# Patient Record
Sex: Male | Born: 2000 | Race: Black or African American | Hispanic: No | Marital: Single | Smoking: Never smoker
Health system: Southern US, Community
[De-identification: ages and names within clinical notes are randomized; demographics above are authoritative.]

---
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
## 2020-12-21 ENCOUNTER — Encounter (HOSPITAL_COMMUNITY): Payer: Self-pay | Admitting: Emergency Medicine

## 2020-12-21 ENCOUNTER — Emergency Department (HOSPITAL_COMMUNITY): Payer: Managed Care, Other (non HMO)

## 2020-12-21 ENCOUNTER — Other Ambulatory Visit: Payer: Self-pay

## 2020-12-21 ENCOUNTER — Emergency Department (HOSPITAL_COMMUNITY)
Admission: EM | Admit: 2020-12-21 | Discharge: 2020-12-22 | Disposition: A | Discharge status: Home / Self Care | Payer: Managed Care, Other (non HMO) | Attending: Emergency Medicine | Admitting: Emergency Medicine

## 2020-12-21 DIAGNOSIS — J029 Acute pharyngitis, unspecified: Secondary | ICD-10-CM | POA: Insufficient documentation

## 2020-12-21 DIAGNOSIS — Z5321 Procedure and treatment not carried out due to patient leaving prior to being seen by health care provider: Secondary | ICD-10-CM | POA: Insufficient documentation

## 2020-12-21 DIAGNOSIS — Z20822 Contact with and (suspected) exposure to covid-19: Secondary | ICD-10-CM | POA: Insufficient documentation

## 2020-12-21 LAB — CBC WITH DIFFERENTIAL/PLATELET
Abs Immature Granulocytes: 0.11 10*3/uL — ABNORMAL HIGH (ref 0.00–0.07)
Basophils Absolute: 0.1 10*3/uL (ref 0.0–0.1)
Basophils Relative: 0 %
Eosinophils Absolute: 0.1 10*3/uL (ref 0.0–0.5)
Eosinophils Relative: 0 %
HCT: 42.6 % (ref 39.0–52.0)
Hemoglobin: 12.7 g/dL — ABNORMAL LOW (ref 13.0–17.0)
Immature Granulocytes: 1 %
Lymphocytes Relative: 26 %
Lymphs Abs: 4.4 10*3/uL — ABNORMAL HIGH (ref 0.7–4.0)
MCH: 22.7 pg — ABNORMAL LOW (ref 26.0–34.0)
MCHC: 29.8 g/dL — ABNORMAL LOW (ref 30.0–36.0)
MCV: 76.2 fL — ABNORMAL LOW (ref 80.0–100.0)
Monocytes Absolute: 1.6 10*3/uL — ABNORMAL HIGH (ref 0.1–1.0)
Monocytes Relative: 9 %
Neutro Abs: 11 10*3/uL — ABNORMAL HIGH (ref 1.7–7.7)
Neutrophils Relative %: 64 %
Platelets: 337 10*3/uL (ref 150–400)
RBC: 5.59 MIL/uL (ref 4.22–5.81)
RDW: 14.6 % (ref 11.5–15.5)
WBC: 17.3 10*3/uL — ABNORMAL HIGH (ref 4.0–10.5)
nRBC: 0 % (ref 0.0–0.2)

## 2020-12-21 NOTE — ED Provider Notes (Signed)
Emergency Medicine Provider Triage Evaluation Note  Craig Carroll , a 20 y.o. male  was evaluated in triage.  Pt complains of sore throat.  The patient reports that he developed a sore throat approximately 2 weeks ago.  About a week ago, the patient started noticing swelling to his bilateral neck, right greater than left.  He reports that the onset of swelling has been gradual.  He does feel as if the swelling is slightly improved today more so than the last few days, but is concerned due to how long his symptoms have been ongoing without resolving.  No fever, chills, trismus, drooling, throat closing, tongue or lip swelling, shortness of breath, wheezing, nasal congestion, vomiting.  No chronic medical conditions.   Review of Systems  Positive: Sore throat, neck swelling, lymphadenopathy Negative: Fever, chills, headache, shortness of breath, throat closing, rash, vomiting, chest pain, neck stiffness, voice change, difficulty swallowing  Physical Exam  BP (!) 160/96 (BP Location: Left Arm)   Pulse 72   Temp 98.4 F (36.9 C) (Oral)   Resp 20   Ht 5\' 10"  (1.778 m)   Wt 93 kg   SpO2 100%   BMI 29.42 kg/m  Gen:   Awake, no distress    Resp:  Normal effort  MSK:   Moves extremities without difficulty  Other:  No trismus.  Tonsils 3+ on the right and 2+ on the left.  Uvula is midline.  Tolerating secretions without difficulty.  There is bilateral anterior cervical lymphadenopathy.  There is also a soft, mass noted to the right anterolateral neck.  No posterior cervical lymphadenopathy.  No meningismus.  Phonation is normal.  Able to speak in complete, fluent sentences without increased work of breathing.  Medical Decision Making  Medically screening exam initiated at 11:12 PM.  Appropriate orders placed.  Craig Carroll was informed that the remainder of the evaluation will be completed by another provider, this initial triage assessment does not replace that evaluation, and the  importance of remaining in the ED until their evaluation is complete.  20 year old male with no chronic medical conditions presenting with a 2-week history of sore throat and gradually worsening bilateral neck swelling over the last week.  Airway is widely patent at this time.  No constitutional symptoms.  On exam, he does have bilateral anterior cervical lymphadenopathy.  There is also a soft, mobile mass noted to the right anterior neck.  I suspect this is a confluence of lymph nodes, but swelling is bilateral on the neck and tonsillar edema is asymmetric.  It could be peritonsillar abscess, but given bilateral tonsillar edema and neck swelling, will order basic labs and a CT scan for further imaging.  Patient will require further work-up and evaluation in the emergency department.   12 A, PA-C 12/21/20 2316    Little, 12/23/20, MD 12/21/20 [phone]

## 2020-12-21 NOTE — ED Triage Notes (Signed)
Patient reports worsening sore throat with swollen tonsils onset last week , no SOB , denies fever or chills .

## 2020-12-22 LAB — BASIC METABOLIC PANEL
Anion gap: 6 (ref 5–15)
BUN: 7 mg/dL (ref 6–20)
CO2: 30 mmol/L (ref 22–32)
Calcium: 9.6 mg/dL (ref 8.9–10.3)
Chloride: 102 mmol/L (ref 98–111)
Creatinine, Ser: 0.78 mg/dL (ref 0.61–1.24)
GFR, Estimated: >60 mL/min (ref >60)
Glucose, Bld: 108 mg/dL — ABNORMAL HIGH (ref 70–99)
Potassium: 4.6 mmol/L (ref 3.5–5.1)
Sodium: 138 mmol/L (ref 135–145)

## 2020-12-22 LAB — GROUP A STREP BY PCR: Group A Strep by PCR: DETECTED — AB

## 2020-12-22 LAB — MONONUCLEOSIS SCREEN: Mono Screen: NEGATIVE

## 2020-12-22 LAB — RESP PANEL BY RT-PCR (FLU A&B, COVID) ARPGX2
Influenza A by PCR: NEGATIVE
Influenza B by PCR: NEGATIVE
SARS Coronavirus 2 by RT PCR: NEGATIVE

## 2020-12-22 NOTE — ED Notes (Signed)
Patient called for vitals recheck with no response 

## 2023-01-01 IMAGING — CT CT NECK W/O CM
3 of 6 series · 11 of 33 positions shown, 13 images · non-contrast
Comparison: None available.

CLINICAL DATA: Initial evaluation for acute sore throat.

EXAM:
CT NECK WITHOUT CONTRAST
TECHNIQUE: Multidetector CT imaging of the neck was performed following the
standard protocol without intravenous contrast.

[Series 3: neck 2.0 st · axial · 0.45mm/px · z∈[+1378,+1526]mm · 3 of 149 slices shown, 4 images]
[im 38/149  soft-tissue]
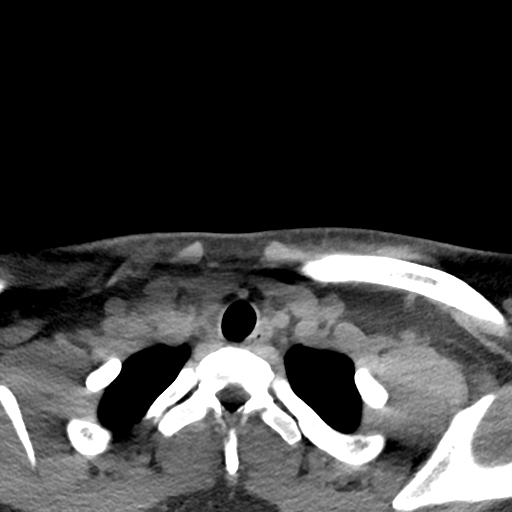
[im 38/149  bone]
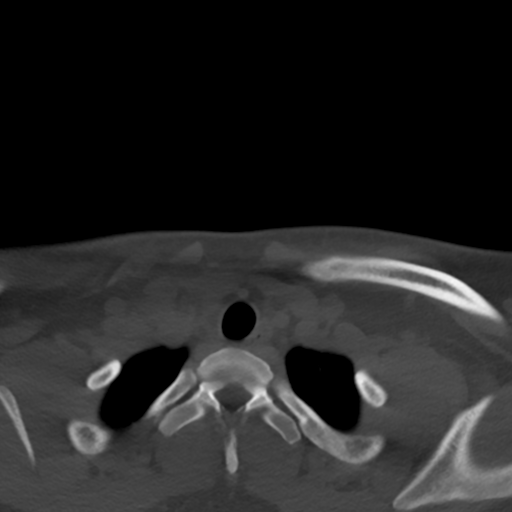
[im 75/149  bone]
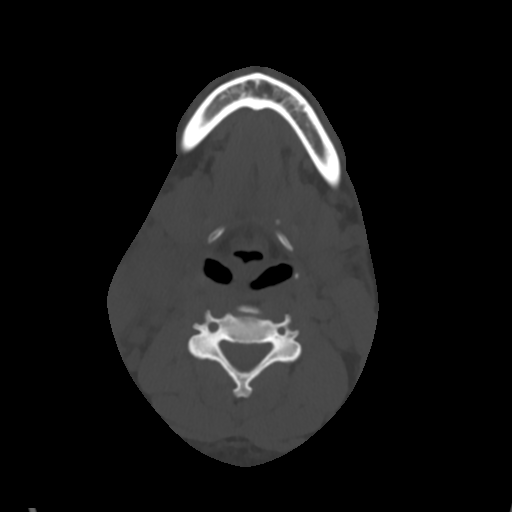
[im 112/149  bone]
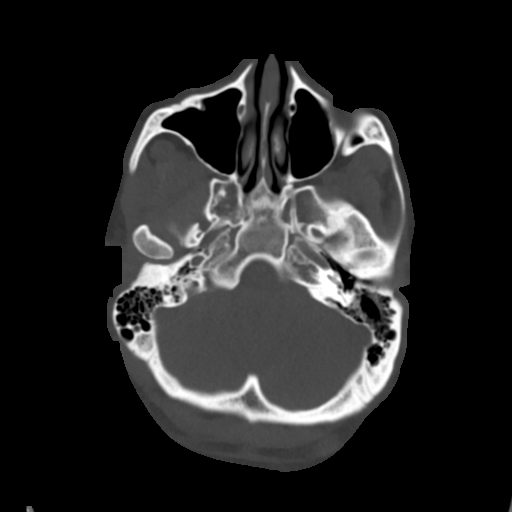

[Series 5: sagittal · sagittal · 0.43mm/px · 5 of 101 slices shown, 6 images]
[im 34/101  bone]
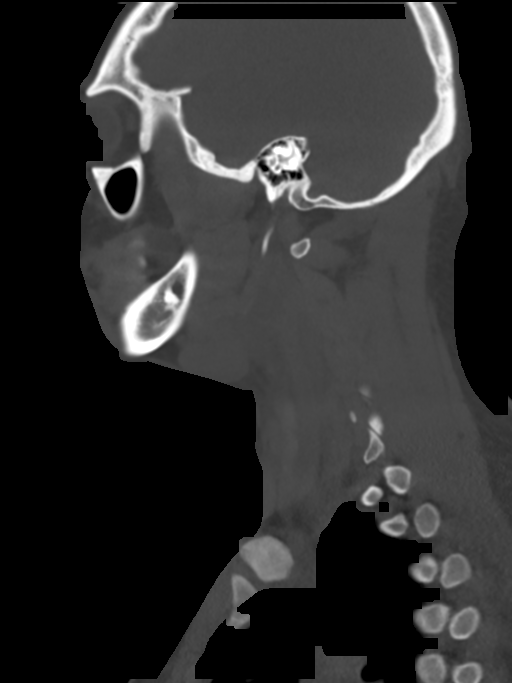
[im 42/101  bone]
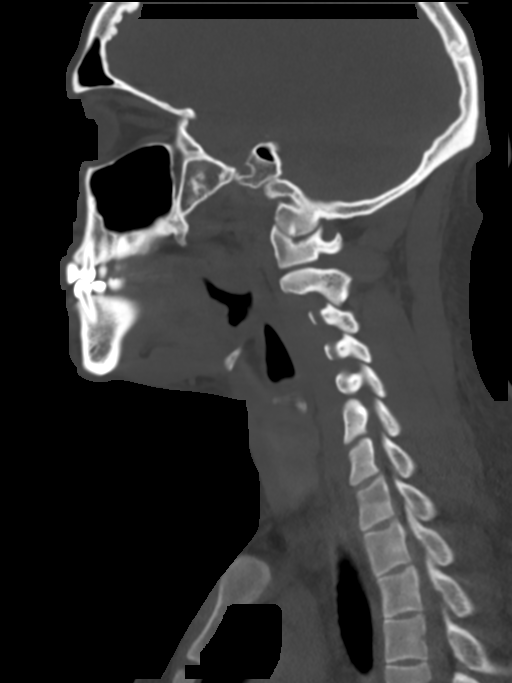
[im 51/101  soft-tissue]
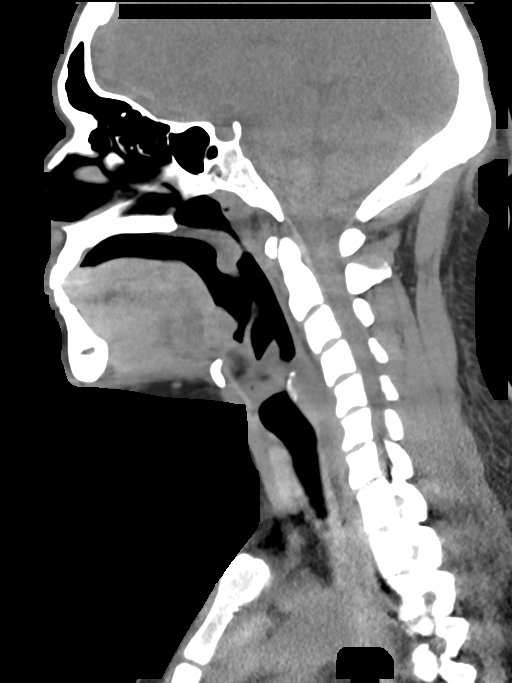
[im 51/101  bone]
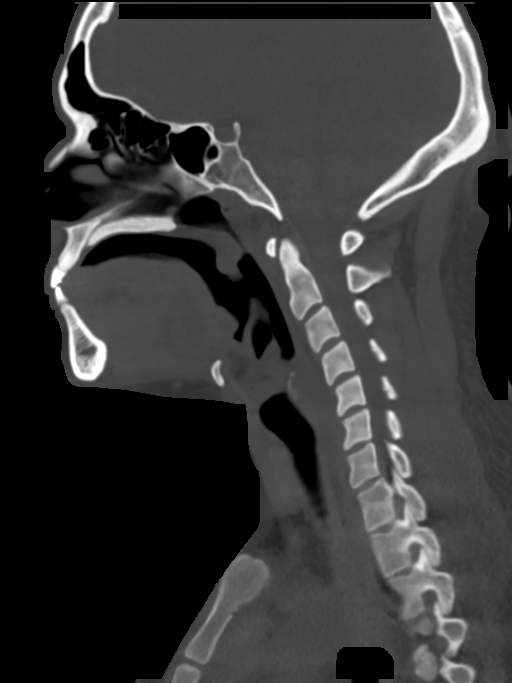
[im 59/101  bone]
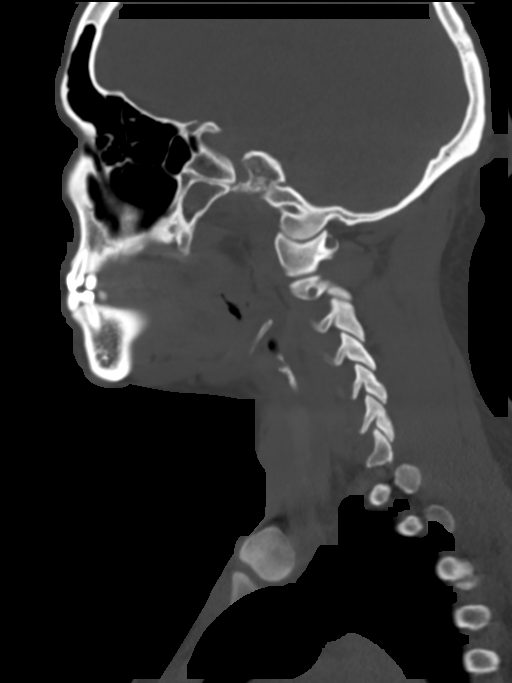
[im 67/101  bone]
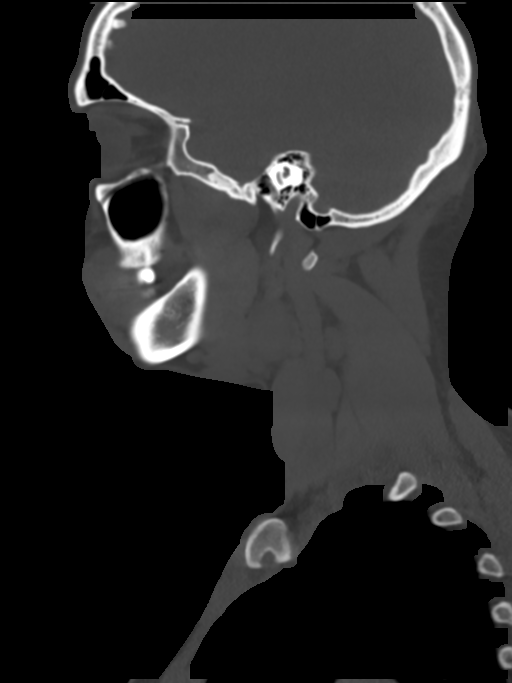

[Series 6: coronal · coronal · 0.35mm/px · 3 of 110 slices shown]
[im 22/110  bone]
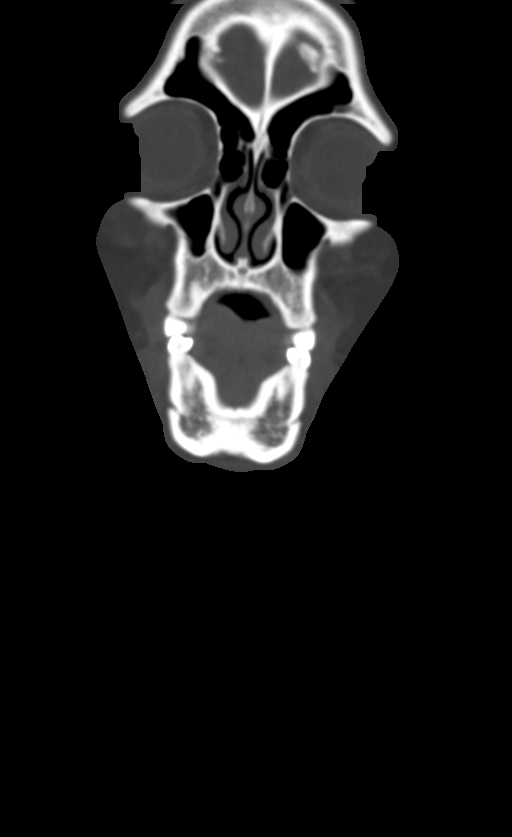
[im 44/110  bone]
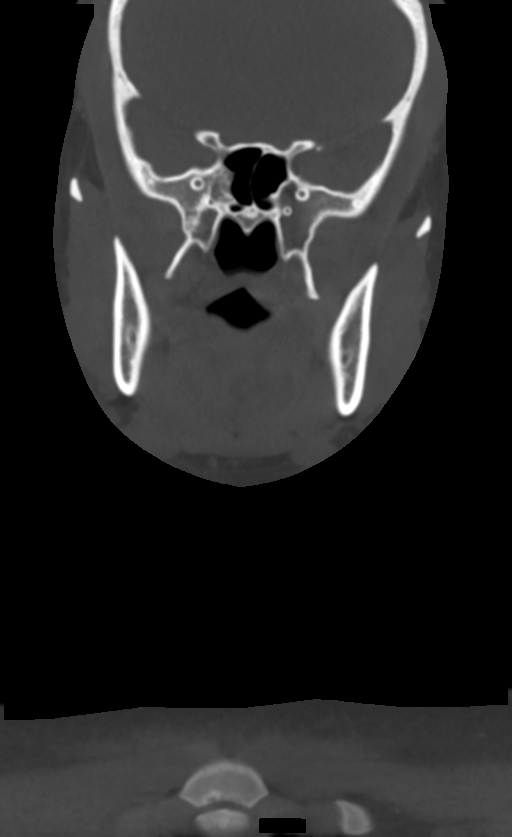
[im 66/110  bone]
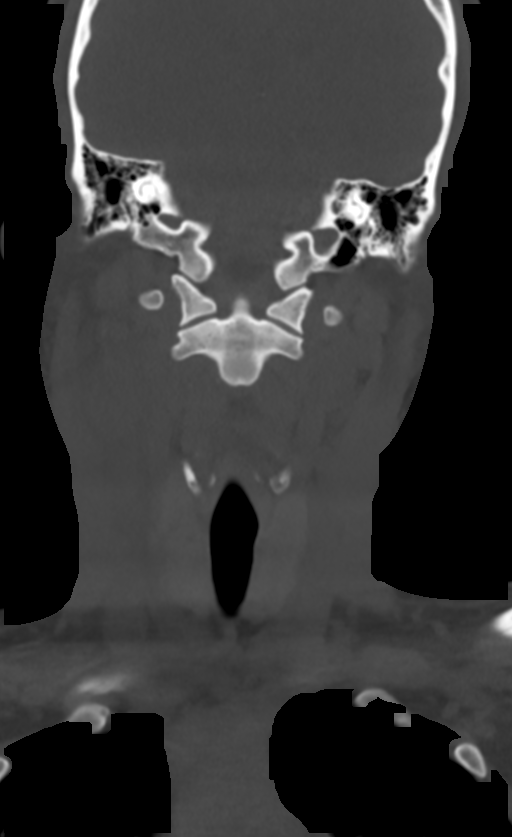

[11 of 33 positions shown; findings below may reference images not displayed]

FINDINGS: Pharynx and larynx: Oral cavity within normal limits. No acute
abnormality about the dentition. Palatine tonsils are symmetric and
within normal limits. Nasopharynx within normal limits. There is
mild thickening and edematous changes involving the aryepiglottic
folds bilaterally, suggesting acute supraglottitis (series 3, image
75). The epiglottis itself is thin without evidence for acute
epiglottitis. Trace layering retropharyngeal effusion, likely
reactive. Supraglottic airway remains patent and clear at this time.
Glottis is closed and not well assessed, but grossly within normal
limits. Subglottic airway patent and clear.

Salivary glands: Salivary glands including the parotid and
submandibular glands are within normal limits.

Thyroid: Normal.

Lymph nodes: Pronounced bulky adenopathy seen involving the neck
bilaterally, right greater than left, most pronounced at levels 2
and 3. Largest node seen on the right at level 2A and measures
cm in short axis (series 3, image 72). Associated marginal
irregularity with hazy inflammatory stranding within the adjacent
right parapharyngeal and submandibular spaces, consistent with acute
lymphadenitis. Similar but less pronounced changes seen on the left,
with the largest left-sided node also seen at level 2 a and measures
1.7 cm in short axis. No obvious suppuration or liquefaction on this
noncontrast exam.

Vascular: Evaluation of the vascular structures limited given lack
of IV contrast.

Limited intracranial: Unremarkable.

Visualized orbits: Unremarkable.

Mastoids and visualized paranasal sinuses: 6 mastoid air cells and
middle ear cavities are well pneumatized and free of fluid.

Skeleton: No acute osseous finding. No discrete or worrisome osseous
lesions.

Upper chest: Visualized upper chest demonstrates no acute finding.
Partially visualized lungs are clear.

Other: None.
IMPRESSION: 1. Mild thickening and edematous changes involving the aryepiglottic
folds bilaterally, suggesting acute supraglottitis. No discrete
abscess or drainable fluid collection. Oropharyngeal airway remains
patent at this time.
2. Pronounced adenopathy with associated inflammatory stranding
involving the neck bilaterally, right greater than left, likely
reactive and/or reflecting concomitant lymphadenitis. No obvious
suppuration or liquefaction on this noncontrast exam. Clinical
follow-up to resolution recommended, as a lymphoproliferative
disorder or nodal metastases could also have this appearance.
